# Patient Record
Sex: Male | Born: 1990
Health system: Southern US, Community
[De-identification: ages and names within clinical notes are randomized; demographics above are authoritative.]

## PROBLEM LIST (undated history)

## (undated) DIAGNOSIS — B0089 Other herpesviral infection: Secondary | ICD-10-CM

## (undated) DIAGNOSIS — W3400XA Accidental discharge from unspecified firearms or gun, initial encounter: Secondary | ICD-10-CM

## (undated) HISTORY — PX: LEG SURGERY: SHX1003

---
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
## 2013-07-20 ENCOUNTER — Emergency Department (HOSPITAL_COMMUNITY)
Admission: EM | Admit: 2013-07-20 | Discharge: 2013-07-20 | Disposition: A | Discharge status: Home / Self Care | Payer: Self-pay | Attending: Emergency Medicine | Admitting: Emergency Medicine

## 2013-07-20 ENCOUNTER — Encounter (HOSPITAL_COMMUNITY): Payer: Self-pay | Admitting: Emergency Medicine

## 2013-07-20 ENCOUNTER — Emergency Department (HOSPITAL_COMMUNITY): Payer: Self-pay

## 2013-07-20 DIAGNOSIS — Y9239 Other specified sports and athletic area as the place of occurrence of the external cause: Secondary | ICD-10-CM | POA: Insufficient documentation

## 2013-07-20 DIAGNOSIS — Y92838 Other recreation area as the place of occurrence of the external cause: Secondary | ICD-10-CM

## 2013-07-20 DIAGNOSIS — Y9367 Activity, basketball: Secondary | ICD-10-CM | POA: Insufficient documentation

## 2013-07-20 DIAGNOSIS — X500XXA Overexertion from strenuous movement or load, initial encounter: Secondary | ICD-10-CM | POA: Insufficient documentation

## 2013-07-20 DIAGNOSIS — S93409A Sprain of unspecified ligament of unspecified ankle, initial encounter: Secondary | ICD-10-CM | POA: Insufficient documentation

## 2013-07-20 DIAGNOSIS — M25476 Effusion, unspecified foot: Secondary | ICD-10-CM | POA: Insufficient documentation

## 2013-07-20 DIAGNOSIS — M25473 Effusion, unspecified ankle: Secondary | ICD-10-CM | POA: Insufficient documentation

## 2013-07-20 HISTORY — DX: Accidental discharge from unspecified firearms or gun, initial encounter: W34.00XA

## 2013-07-20 MED ORDER — IBUPROFEN 600 MG PO TABS: 600.0000 mg | ORAL_TABLET | Freq: Three times a day (TID) | ORAL | Status: DC

## 2013-07-20 MED ORDER — IBUPROFEN 200 MG PO TABS
600.0000 mg | ORAL_TABLET | Freq: Once | ORAL | Status: AC
  Administered 2013-07-20: 600 mg via ORAL
  Filled 2013-07-20: qty 3

## 2013-07-20 NOTE — ED Notes (Signed)
Pt reports that he has been having intermittent R ankle pain, tried to play basketball today and now is unable to walk on R leg d/t pain, mild swelling noted, ice applied in triage. Pt a&o, NAD noted at this time.

## 2013-07-20 NOTE — Discharge Instructions (Signed)
Acute Ankle Sprain  with Phase I Rehab  An acute ankle sprain is a partial or complete tear in one or more of the ligaments of the ankle due to traumatic injury. The severity of the injury depends on both the the number of ligaments sprained and the grade of sprain. There are 3 grades of sprains.   · A grade 1 sprain is a mild sprain. There is a slight pull without obvious tearing. There is no loss of strength, and the muscle and ligament are the correct length.  · A grade 2 sprain is a moderate sprain. There is tearing of fibers within the substance of the ligament where it connects two bones or two cartilages. The length of the ligament is increased, and there is usually decreased strength.  · A grade 3 sprain is a complete rupture of the ligament and is uncommon.  In addition to the grade of sprain, there are three types of ankle sprains.   Lateral ankle sprains: This is a sprain of one or more of the three ligaments on the outer side (lateral) of the ankle. These are the most common sprains.  Medial ankle sprains: There is one large triangular ligament of the inner side (medial) of the ankle that is susceptible to injury. Medial ankle sprains are less common.  Syndesmosis, "high ankle," sprains: The syndesmosis is the ligament that connects the two bones of the lower leg. Syndesmosis sprains usually only occur with very severe ankle sprains.  SYMPTOMS  · Pain, tenderness, and swelling in the ankle, starting at the side of injury that may progress to the whole ankle and foot with time.  · "Pop" or tearing sensation at the time of injury.  · Bruising that may spread to the heel.  · Impaired ability to walk soon after injury.  CAUSES   · Acute ankle sprains are caused by trauma placed on the ankle that temporarily forces or pries the anklebone (talus) out of its normal socket.  · Stretching or tearing of the ligaments that normally hold the joint in place (usually due to a twisting injury).  RISK INCREASES  WITH:  · Previous ankle sprain.  · Sports in which the foot may land awkwardly (ie. basketball, volleyball, or soccer) or walking or running on uneven or rough surfaces.  · Shoes with inadequate support to prevent sideways motion when stress occurs.  · Poor strength and flexibility.  · Poor balance skills.  · Contact sports.  PREVENTION   · Warm up and stretch properly before activity.  · Maintain physical fitness:  · Ankle and leg flexibility, muscle strength, and endurance.  · Cardiovascular fitness.  · Balance training activities.  · Use proper technique and have a coach correct improper technique.  · Taping, protective strapping, bracing, or high-top tennis shoes may help prevent injury. Initially, tape is best; however, it loses most of its support function within 10 to 15 minutes.  · Wear proper fitted protective shoes (High-top shoes with taping or bracing is more effective than either alone).  · Provide the ankle with support during sports and practice activities for 12 months following injury.  PROGNOSIS   · If treated properly, ankle sprains can be expected to recover completely; however, the length of recovery depends on the degree of injury.  · A grade 1 sprain usually heals enough in 5 to 7 days to allow modified activity and requires an average of 6 weeks to heal completely.  · A grade 2 sprain requires   symptoms may result in a chronic problem. Appropriately addressing the problem the first time decreases the frequency of recurrence and optimizes healing time. Severity of the initial sprain does not predict the likelihood of later instability.  Injury to other structures (bone, cartilage, or tendon).  A chronically unstable or arthritic ankle joint is a possiblity with repeated  sprains. TREATMENT Treatment initially involves the use of ice, medication, and compression bandages to help reduce pain and inflammation. Ankle sprains are usually immobilized in a walking cast or boot to allow for healing. Crutches may be recommended to reduce pressure on the injury. After immobilization, strengthening and stretching exercises may be necessary to regain strength and a full range of motion. Surgery is rarely needed to treat ankle sprains. MEDICATION   Nonsteroidal anti-inflammatory medications, such as aspirin and ibuprofen (do not take for the first 3 days after injury or within 7 days before surgery), or other minor pain relievers, such as acetaminophen, are often recommended. Take these as directed by your caregiver. Contact your caregiver immediately if any bleeding, stomach upset, or signs of an allergic reaction occur from these medications.  Ointments applied to the skin may be helpful.  Pain relievers may be prescribed as necessary by your caregiver. Do not take prescription pain medication for longer than 4 to 7 days. Use only as directed and only as much as you need. HEAT AND COLD  Cold treatment (icing) is used to relieve pain and reduce inflammation for acute and chronic cases. Cold should be applied for 10 to 15 minutes every 2 to 3 hours for inflammation and pain and immediately after any activity that aggravates your symptoms. Use ice packs or an ice massage.  Heat treatment may be used before performing stretching and strengthening activities prescribed by your caregiver. Use a heat pack or a warm soak. SEEK IMMEDIATE MEDICAL CARE IF:   Pain, swelling, or bruising worsens despite treatment.  You experience pain, numbness, discoloration, or coldness in the foot or toes.  New, unexplained symptoms develop (drugs used in treatment may produce side effects.) EXERCISES  PHASE I EXERCISES RANGE OF MOTION (ROM) AND STRETCHING EXERCISES - Ankle Sprain, Acute Phase I,  Weeks 1 to 2 These exercises may help you when beginning to restore flexibility in your ankle. You will likely work on these exercises for the 1 to 2 weeks after your injury. Once your physician, physical therapist, or athletic trainer sees adequate progress, he or she will advance your exercises. While completing these exercises, remember:   Restoring tissue flexibility helps normal motion to return to the joints. This allows healthier, less painful movement and activity.  An effective stretch should be held for at least 30 seconds.  A stretch should never be painful. You should only feel a gentle lengthening or release in the stretched tissue. RANGE OF MOTION - Dorsi/Plantar Flexion  While sitting with your right / left knee straight, draw the top of your foot upwards by flexing your ankle. Then reverse the motion, pointing your toes downward.  Hold each position for __________ seconds.  After completing your first set of exercises, repeat this exercise with your knee bent. Repeat __________ times. Complete this exercise __________ times per day.  RANGE OF MOTION - Ankle Alphabet  Imagine your right / left big toe is a pen.  Keeping your hip and knee still, write out the entire alphabet with your "pen." Make the letters as large as you can without increasing any discomfort. Repeat __________ times. Complete this  exercise __________ times per day.  STRENGTHENING EXERCISES - Ankle Sprain, Acute -Phase I, Weeks 1 to 2 These exercises may help you when beginning to restore strength in your ankle. You will likely work on these exercises for 1 to 2 weeks after your injury. Once your physician, physical therapist, or athletic trainer sees adequate progress, he or she will advance your exercises. While completing these exercises, remember:   Muscles can gain both the endurance and the strength needed for everyday activities through controlled exercises.  Complete these exercises as instructed by  your physician, physical therapist, or athletic trainer. Progress the resistance and repetitions only as guided.  You may experience muscle soreness or fatigue, but the pain or discomfort you are trying to eliminate should never worsen during these exercises. If this pain does worsen, stop and make certain you are following the directions exactly. If the pain is still present after adjustments, discontinue the exercise until you can discuss the trouble with your clinician. STRENGTH - Dorsiflexors  Secure a rubber exercise band/tubing to a fixed object (ie. table, pole) and loop the other end around your right / left foot.  Sit on the floor facing the fixed object. The band/tubing should be slightly tense when your foot is relaxed.  Slowly draw your foot back toward you using your ankle and toes.  Hold this position for __________ seconds. Slowly release the tension in the band and return your foot to the starting position. Repeat __________ times. Complete this exercise __________ times per day.  STRENGTH - Plantar-flexors   Sit with your right / left leg extended. Holding onto both ends of a rubber exercise band/tubing, loop it around the ball of your foot. Keep a slight tension in the band.  Slowly push your toes away from you, pointing them downward.  Hold this position for __________ seconds. Return slowly, controlling the tension in the band/tubing. Repeat __________ times. Complete this exercise __________ times per day.  STRENGTH - Ankle Eversion  Secure one end of a rubber exercise band/tubing to a fixed object (table, pole). Loop the other end around your foot just before your toes.  Place your fists between your knees. This will focus your strengthening at your ankle.  Drawing the band/tubing across your opposite foot, slowly, pull your little toe out and up. Make sure the band/tubing is positioned to resist the entire motion.  Hold this position for __________ seconds. Have  your muscles resist the band/tubing as it slowly pulls your foot back to the starting position.  Repeat __________ times. Complete this exercise __________ times per day.  STRENGTH - Ankle Inversion  Secure one end of a rubber exercise band/tubing to a fixed object (table, pole). Loop the other end around your foot just before your toes.  Place your fists between your knees. This will focus your strengthening at your ankle.  Slowly, pull your big toe up and in, making sure the band/tubing is positioned to resist the entire motion.  Hold this position for __________ seconds.  Have your muscles resist the band/tubing as it slowly pulls your foot back to the starting position. Repeat __________ times. Complete this exercises __________ times per day.  STRENGTH - Towel Curls  Sit in a chair positioned on a non-carpeted surface.  Place your right / left foot on a towel, keeping your heel on the floor.  Pull the towel toward your heel by only curling your toes. Keep your heel on the floor.  If instructed by your physician,  physical therapist, or athletic trainer, add weight to the end of the towel. °Repeat ______5____ times. Complete this exercise ______2____ times per day. °Document Released: 01/12/2005 Document Revised: 09/05/2011 Document Reviewed: 09/25/2008 °[employer]® Patient Information ©2014 [employer]. ° °

## 2013-07-20 NOTE — ED Provider Notes (Signed)
Medical screening examination/treatment/procedure(s) were performed by non-physician practitioner and as supervising physician I was immediately available for consultation/collaboration.  EKG Interpretation   None        Ethelda ChickMartha K Linker, MD 07/20/13 2314
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
## 2015-01-19 ENCOUNTER — Emergency Department (HOSPITAL_COMMUNITY)
Admission: EM | Admit: 2015-01-19 | Discharge: 2015-01-19 | Disposition: A | Discharge status: Home / Self Care | Payer: Self-pay | Attending: Emergency Medicine | Admitting: Emergency Medicine

## 2015-01-19 ENCOUNTER — Emergency Department (HOSPITAL_COMMUNITY): Payer: Self-pay

## 2015-01-19 ENCOUNTER — Encounter (HOSPITAL_COMMUNITY): Payer: Self-pay | Admitting: Emergency Medicine

## 2015-01-19 DIAGNOSIS — W231XXA Caught, crushed, jammed, or pinched between stationary objects, initial encounter: Secondary | ICD-10-CM | POA: Insufficient documentation

## 2015-01-19 DIAGNOSIS — S6000XA Contusion of unspecified finger without damage to nail, initial encounter: Secondary | ICD-10-CM

## 2015-01-19 DIAGNOSIS — Y9289 Other specified places as the place of occurrence of the external cause: Secondary | ICD-10-CM | POA: Insufficient documentation

## 2015-01-19 DIAGNOSIS — S60042A Contusion of left ring finger without damage to nail, initial encounter: Secondary | ICD-10-CM | POA: Insufficient documentation

## 2015-01-19 DIAGNOSIS — Y9389 Activity, other specified: Secondary | ICD-10-CM | POA: Insufficient documentation

## 2015-01-19 DIAGNOSIS — Y998 Other external cause status: Secondary | ICD-10-CM | POA: Insufficient documentation

## 2015-01-19 MED ORDER — IBUPROFEN 400 MG PO TABS
600.0000 mg | ORAL_TABLET | Freq: Once | ORAL | Status: AC
  Administered 2015-01-19: 600 mg via ORAL
  Filled 2015-01-19 (×2): qty 1

## 2015-01-19 MED ORDER — IBUPROFEN 600 MG PO TABS: 600.0000 mg | ORAL_TABLET | Freq: Four times a day (QID) | ORAL | Status: DC | PRN
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
## 2015-01-19 NOTE — Discharge Instructions (Signed)
Subungual Hematoma A subungual hematoma is a pocket of blood that collects under the fingernail or toenail. The pressure created by the blood under the nail can cause pain. CAUSES  A subungual hematoma occurs when an injury to the finger or toe causes a blood vessel beneath the nail to break. The injury can occur from a direct blow such as slamming a finger in a door. It can also occur from a repeated injury such as pressure on the foot in a shoe while running. A subungual hematoma is sometimes called runner's toe or tennis toe. SYMPTOMS   Blue or dark blue skin under the nail.  Pain or throbbing in the injured area. DIAGNOSIS  Your caregiver can determine whether you have a subungual hematoma based on your history and a physical exam. If your caregiver thinks you might have a broken (fractured) bone, X-rays may be taken. TREATMENT  Hematomas usually go away on their own over time. Your caregiver may make a hole in the nail to drain the blood. The nail usually grows back normally after this procedure. In some cases, the nail may need to be removed. This is done if there is a cut under the nail that requires stitches (sutures). HOME CARE INSTRUCTIONS   Put ice on the injured area.  Put ice in a plastic bag.  Place a towel between your skin and the bag.  Leave the ice on for 15-20 minutes, 03-04 times a day for the first 1 to 2 days.  Elevate the injured area to help decrease pain and swelling.  If you were given a bandage, wear it for as long as directed by your caregiver.  If part of your nail falls off, trim the remaining nail gently. This prevents the nail from catching on something and causing further injury.  Only take over-the-counter or prescription medicines for pain, discomfort, or fever as directed by your caregiver. SEEK IMMEDIATE MEDICAL CARE IF:   You have redness or swelling around the nail.  You have yellowish-white fluid (pus) coming from the nail.  Your pain is not  controlled with medicine.  You have a fever. MAKE SURE YOU:  Understand these instructions.  Will watch your condition.  Will get help right away if you are not doing well or get worse. Document Released: 06/10/2000 Document Revised: 09/05/2011 Document Reviewed: 06/01/2011 [facility] Patient Information 2015 [place], [place]. This information is not intended to replace advice given to you by your health care provider. Make sure you discuss any questions you have with your health care provider.

## 2015-01-19 NOTE — ED Notes (Signed)
Pt ambulating independently w/ steady gait on d/c in no acute distress, A&Ox4. D/c instructions reviewed w/ pt and family - pt and family deny any further questions or concerns at present. Rx given x1  
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
## 2016-03-10 ENCOUNTER — Emergency Department (HOSPITAL_COMMUNITY)
Admission: EM | Admit: 2016-03-10 | Discharge: 2016-03-11 | Disposition: A | Discharge status: Home / Self Care | Payer: Self-pay | Attending: Emergency Medicine | Admitting: Emergency Medicine

## 2016-03-10 ENCOUNTER — Encounter (HOSPITAL_COMMUNITY): Payer: Self-pay | Admitting: *Deleted

## 2016-03-10 DIAGNOSIS — R238 Other skin changes: Secondary | ICD-10-CM

## 2016-03-10 DIAGNOSIS — N501 Vascular disorders of male genital organs: Secondary | ICD-10-CM | POA: Insufficient documentation
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
## 2016-03-10 NOTE — ED Triage Notes (Signed)
Pt states that he has "bumps" on his penis.  No urinary symptoms, no drainage from penis, no itching.

## 2016-12-19 ENCOUNTER — Encounter (HOSPITAL_COMMUNITY): Payer: Self-pay | Admitting: Emergency Medicine

## 2016-12-19 ENCOUNTER — Encounter (HOSPITAL_COMMUNITY): Payer: Self-pay

## 2016-12-19 ENCOUNTER — Emergency Department (HOSPITAL_COMMUNITY)
Admission: EM | Admit: 2016-12-19 | Discharge: 2016-12-19 | Disposition: A | Discharge status: Home / Self Care | Payer: Self-pay | Attending: Emergency Medicine | Admitting: Emergency Medicine

## 2016-12-19 ENCOUNTER — Ambulatory Visit (HOSPITAL_COMMUNITY)
Admission: EM | Admit: 2016-12-19 | Discharge: 2016-12-19 | Disposition: A | Discharge status: Home / Self Care | Payer: Self-pay | Attending: Family Medicine | Admitting: Family Medicine

## 2016-12-19 DIAGNOSIS — K029 Dental caries, unspecified: Secondary | ICD-10-CM

## 2016-12-19 DIAGNOSIS — Z5321 Procedure and treatment not carried out due to patient leaving prior to being seen by health care provider: Secondary | ICD-10-CM | POA: Insufficient documentation

## 2016-12-19 DIAGNOSIS — K0889 Other specified disorders of teeth and supporting structures: Secondary | ICD-10-CM

## 2016-12-19 MED ORDER — IBUPROFEN 800 MG PO TABS: 800.0000 mg | ORAL_TABLET | Freq: Three times a day (TID) | ORAL | 0 refills | Status: DC

## 2016-12-19 MED ORDER — PENICILLIN V POTASSIUM 500 MG PO TABS: 500.0000 mg | ORAL_TABLET | Freq: Three times a day (TID) | ORAL | 0 refills | Status: DC

## 2016-12-19 NOTE — Discharge Instructions (Signed)
You need to see a dentist to have this problem taken care of. One possible dentist as listed below.
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
## 2016-12-19 NOTE — ED Triage Notes (Signed)
Pt states he has had a toothache X2 days. Broken tooth noted. He reports associated headache as well.

## 2016-12-19 NOTE — ED Notes (Signed)
[facility]

## 2016-12-19 NOTE — ED Notes (Signed)
Called to room with no answer x 2.

## 2016-12-19 NOTE — ED Triage Notes (Signed)
The patient presented to the [facility] with a complaint of dental pain x 2 days. 

## 2017-02-24 ENCOUNTER — Ambulatory Visit (HOSPITAL_COMMUNITY)
Admission: EM | Admit: 2017-02-24 | Discharge: 2017-02-24 | Disposition: A | Discharge status: Home / Self Care | Payer: Self-pay | Attending: Physician Assistant | Admitting: Physician Assistant

## 2017-02-24 ENCOUNTER — Encounter (HOSPITAL_COMMUNITY): Payer: Self-pay | Admitting: Physician Assistant

## 2017-02-24 DIAGNOSIS — L0291 Cutaneous abscess, unspecified: Secondary | ICD-10-CM

## 2017-02-24 MED ORDER — LIDOCAINE-EPINEPHRINE (PF) 2 %-1:200000 IJ SOLN
INTRAMUSCULAR | Status: AC
  Filled 2017-02-24: qty 20

## 2017-02-24 MED ORDER — AMOXICILLIN-POT CLAVULANATE 875-125 MG PO TABS: 1.0000 | ORAL_TABLET | Freq: Two times a day (BID) | ORAL | 0 refills | Status: DC

## 2017-02-24 NOTE — ED Triage Notes (Signed)
Patient with abscess to left lower chin. Drainage noted.

## 2017-02-24 NOTE — Discharge Instructions (Signed)
Start Augmentin as directed. Keep incision site clean and dry with soap and water. Take tylenol/motrin for pain. If experiencing worsening symptoms, fever, increased swelling, spreading redness, increased warmth, increased pain, follow-up for reevaluation. If experiencing trouble breathing, trouble swallowing, swelling of the throat, go to the emergency department for further evaluation.
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
## 2017-02-24 NOTE — ED Notes (Signed)
Patient given supplies for home dressing changes. S/s of infection reviewed.

## 2017-02-27 ENCOUNTER — Encounter (HOSPITAL_COMMUNITY): Payer: Self-pay | Admitting: Emergency Medicine

## 2017-02-27 ENCOUNTER — Emergency Department (HOSPITAL_COMMUNITY)
Admission: EM | Admit: 2017-02-27 | Discharge: 2017-02-27 | Disposition: A | Discharge status: Home / Self Care | Payer: Self-pay | Attending: Emergency Medicine | Admitting: Emergency Medicine

## 2017-02-27 ENCOUNTER — Emergency Department (HOSPITAL_COMMUNITY): Payer: Self-pay

## 2017-02-27 DIAGNOSIS — Z79899 Other long term (current) drug therapy: Secondary | ICD-10-CM | POA: Insufficient documentation

## 2017-02-27 DIAGNOSIS — L0201 Cutaneous abscess of face: Secondary | ICD-10-CM | POA: Insufficient documentation

## 2017-02-27 DIAGNOSIS — L0291 Cutaneous abscess, unspecified: Secondary | ICD-10-CM

## 2017-02-27 LAB — CBC WITH DIFFERENTIAL/PLATELET
BASOS ABS: 0 10*3/uL (ref 0.0–0.1)
Basophils Relative: 0 %
EOS PCT: 2 %
Eosinophils Absolute: 0.1 10*3/uL (ref 0.0–0.7)
HCT: 41.1 % (ref 39.0–52.0)
Hemoglobin: 14 g/dL (ref 13.0–17.0)
LYMPHS PCT: 29 %
Lymphs Abs: 2.1 10*3/uL (ref 0.7–4.0)
MCH: 29.9 pg (ref 26.0–34.0)
MCHC: 34.1 g/dL (ref 30.0–36.0)
MCV: 87.8 fL (ref 78.0–100.0)
MONO ABS: 0.7 10*3/uL (ref 0.1–1.0)
MONOS PCT: 9 %
Neutro Abs: 4.3 10*3/uL (ref 1.7–7.7)
Neutrophils Relative %: 60 %
PLATELETS: 310 10*3/uL (ref 150–400)
RBC: 4.68 MIL/uL (ref 4.22–5.81)
RDW: 13 % (ref 11.5–15.5)
WBC: 7.2 10*3/uL (ref 4.0–10.5)

## 2017-02-27 LAB — I-STAT CHEM 8, ED
BUN: 8 mg/dL (ref 6–20)
CREATININE: 0.9 mg/dL (ref 0.61–1.24)
Calcium, Ion: 1.21 mmol/L (ref 1.15–1.40)
Chloride: 103 mmol/L (ref 101–111)
GLUCOSE: 87 mg/dL (ref 65–99)
HCT: 42 % (ref 39.0–52.0)
HEMOGLOBIN: 14.3 g/dL (ref 13.0–17.0)
POTASSIUM: 3.9 mmol/L (ref 3.5–5.1)
Sodium: 141 mmol/L (ref 135–145)
TCO2: 28 mmol/L (ref 22–32)

## 2017-02-27 MED ORDER — CLINDAMYCIN PHOSPHATE 600 MG/50ML IV SOLN
600.0000 mg | Freq: Once | INTRAVENOUS | Status: AC
  Administered 2017-02-27: 600 mg via INTRAVENOUS
  Filled 2017-02-27: qty 50

## 2017-02-27 MED ORDER — LIDOCAINE HCL (PF) 1 % IJ SOLN
5.0000 mL | Freq: Once | INTRAMUSCULAR | Status: AC
  Administered 2017-02-27: 5 mL via INTRADERMAL
  Filled 2017-02-27: qty 5

## 2017-02-27 MED ORDER — IOPAMIDOL (ISOVUE-300) INJECTION 61%
INTRAVENOUS | Status: AC
  Administered 2017-02-27: 75 mL
  Filled 2017-02-27: qty 75

## 2017-02-27 NOTE — Discharge Instructions (Signed)
Please follow with your primary care doctor in the next 2 days for a check-up. They must obtain records for further management.  ° °Do not hesitate to return to the Emergency Department for any new, worsening or concerning symptoms.  ° °

## 2017-02-27 NOTE — ED Notes (Signed)
Patient transported to CT 
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
## 2017-02-27 NOTE — ED Triage Notes (Signed)
Abscess noted to left side of neck.  Reports going to UC two days ago and that it has gotten worse since then.  Still taking antibiotics.

## 2017-05-14 ENCOUNTER — Emergency Department (HOSPITAL_COMMUNITY)
Admission: EM | Admit: 2017-05-14 | Discharge: 2017-05-14 | Disposition: A | Discharge status: Home / Self Care | Payer: Self-pay | Attending: Emergency Medicine | Admitting: Emergency Medicine

## 2017-05-14 ENCOUNTER — Encounter (HOSPITAL_COMMUNITY): Payer: Self-pay | Admitting: *Deleted

## 2017-05-14 ENCOUNTER — Other Ambulatory Visit: Payer: Self-pay

## 2017-05-14 DIAGNOSIS — Z79899 Other long term (current) drug therapy: Secondary | ICD-10-CM | POA: Insufficient documentation

## 2017-05-14 DIAGNOSIS — B0089 Other herpesviral infection: Secondary | ICD-10-CM | POA: Insufficient documentation

## 2017-05-14 MED ORDER — VALACYCLOVIR HCL 500 MG PO TABS
1000.0000 mg | ORAL_TABLET | Freq: Once | ORAL | Status: AC
  Administered 2017-05-14: 1000 mg via ORAL
  Filled 2017-05-14: qty 2

## 2017-05-14 MED ORDER — VALACYCLOVIR HCL 1 G PO TABS: 1000.0000 mg | ORAL_TABLET | Freq: Two times a day (BID) | ORAL | 0 refills | Status: AC

## 2017-05-14 NOTE — ED Notes (Signed)
Declined W/C at D/C and was escorted to lobby by RN. 

## 2017-05-14 NOTE — ED Triage Notes (Signed)
PT states R thumb pain and blisters x 3 days.
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
## 2017-05-14 NOTE — Care Management Note (Signed)
Case Management Note  Patient Details  Name: Philip Lowery MRN: [id number] Date of Birth: 05/29/1991  Subjective/Objective:  26 y.o. [facility]. Pt does not have Insurance/PCP. Aware that he can go to Open clinic on Thursday at 0800 as first come first serve and have assist with both. Provided with MATCH letter                  Action/Plan:CM will sign off for now but will be available should additional discharge needs arise or disposition change.    Expected Discharge Date:                  Expected Discharge Plan:     In-House Referral:     Discharge planning Services  CM Consult, Indigent Health Clinic  Post Acute Care Choice:    Choice offered to:  Patient  DME Arranged:    DME Agency:     HH Arranged:    HH Agency:     Status of Service:  Completed, signed off  If discussed at [employer] Meetings, dates discussed:    Additional Comments:  Yvone NeuCrutchfield, Nurah Petrides M, RN 05/14/2017, 11:45 AM

## 2017-05-14 NOTE — Discharge Instructions (Signed)
Please take 1 tablet of Valtrex 2 times per day for the next 7 days.   Please follow-up with [place] and wellness as directed by the care management team.  If your symptoms do not improve after taking treatment for 3-4 days, please make sure to schedule a follow-up appointment because hand surgery may need to be contacted.  You can follow up with the health department to get screened for sexually transmitted infections.  If you develop new or worsening symptoms, including bumps on your penis, headache, confusion, or fever, please return to the emergency department for reevaluation.

## 2017-05-24 ENCOUNTER — Other Ambulatory Visit: Payer: Self-pay

## 2017-05-24 ENCOUNTER — Ambulatory Visit: Payer: Self-pay | Attending: Internal Medicine | Admitting: Physician Assistant

## 2017-05-24 VITALS — BP 135/86 | HR 87 | Temp 98.3°F | Resp 16 | Wt 154.0 lb

## 2017-05-24 DIAGNOSIS — R030 Elevated blood-pressure reading, without diagnosis of hypertension: Secondary | ICD-10-CM

## 2017-05-24 DIAGNOSIS — B0089 Other herpesviral infection: Secondary | ICD-10-CM

## 2017-05-24 MED ORDER — VALACYCLOVIR HCL 1 G PO TABS: 1000.0000 mg | ORAL_TABLET | Freq: Two times a day (BID) | ORAL | 0 refills | Status: DC
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
## 2017-05-24 NOTE — Patient Instructions (Signed)
Check blood pressure 2-3 times per week and record and bring to next visit.

## 2017-06-07 MED FILL — ?VALACYCLOVIR HCL 1 GRAM TA: 1 | 10 days supply | Qty: 20 | Fill #0

## 2017-07-05 ENCOUNTER — Encounter: Payer: Self-pay | Admitting: Nurse Practitioner

## 2017-07-05 ENCOUNTER — Ambulatory Visit: Payer: Self-pay | Attending: Nurse Practitioner | Admitting: Nurse Practitioner

## 2017-07-05 VITALS — BP 106/65 | HR 69 | Temp 97.8°F | Ht 69.0 in | Wt 160.4 lb

## 2017-07-05 DIAGNOSIS — Z9889 Other specified postprocedural states: Secondary | ICD-10-CM | POA: Insufficient documentation

## 2017-07-05 DIAGNOSIS — Z79899 Other long term (current) drug therapy: Secondary | ICD-10-CM | POA: Insufficient documentation

## 2017-07-05 DIAGNOSIS — B0089 Other herpesviral infection: Secondary | ICD-10-CM | POA: Insufficient documentation

## 2017-07-05 MED ORDER — AMOXICILLIN-POT CLAVULANATE 875-125 MG PO TABS: 1.0000 | ORAL_TABLET | Freq: Two times a day (BID) | ORAL | 0 refills | Status: DC

## 2017-07-05 MED FILL — AMOX-CLAV 875-125 MG TABLET: 875-125 | 10 days supply | Qty: 20 | Fill #0

## 2017-07-05 NOTE — Patient Instructions (Addendum)
Herpetic Whitlow  Herpetic whitlow is an infection that affects the skin. It most commonly affects the fingers. The infection can recur, but the first occurrence is usually the most severe.  What are the causes?  This condition is caused by herpes simplex virus (HSV) type 1 and HSV type 2. You can get herpetic whitlow if body fluids that are infected with either of these viruses get into a break in the skin.  HSV commonly affects the mouth and genitals, but it can affect many other parts of the body. Most people who get herpetic whitlow have an HSV infection in another part of the body that spreads to the fingers through a cut. Health care workers can get herpetic whitlow from touching the mouths of patients who have oral herpes.  What increases the risk?  This condition is more likely to develop in:   People who have an HSV infection.   People who work in the health care industry, particularly in dentistry.    What are the signs or symptoms?  Symptoms of this condition usually develop 2-20 days after exposure to the virus. Early symptoms include:   Pain, burning, or tingling in the infected area.   Fever.   Redness.   Swelling.    About 7-10 days after symptoms begin to appear, the lymph nodes may swell, and rice-sized fluid-filled bumps will develop on the infected area. These bumps will develop into sores or break open. Symptoms usually improve 10-14 days later when the sores crust over and heal.  How is this diagnosed?  This condition may be diagnosed with a physical exam. A sample of your skin or your blood may also be taken for testing.  How is this treated?  This condition goes away on its own. Your health care provider may prescribe topical or oral antiviral medicines to relieve symptoms and to prevent the infection from spreading to others.  Follow these instructions at home:   Take or apply medicines only as directed by your health care provider.   Wash your hands often.   If you work in the  health care industry, wear gloves.   Apply ice to the affected area:  ? Put ice in a plastic bag.  ? Place a towel between your skin and the bag.  ? Leave the ice on for 20 minutes, 2-3 times per day.   The infection can spread to other people. It can also spread to other areas of your body, especially to your mouth and your genital area. To keep it from spreading:  ? Cover the affected area with a bandage (dressing). If you work in the health care industry, wear gloves.  ? Avoid close contact with other people until the bumps heal.  ? Do not share towels and washcloths.  ? Do not touch the bumps with your other hand or pick at your scabs.  ? Do not touch your eyes, mouth, or genital area unless you wash your hands first.  Contact a health care provider if:   The infection has spread to another area of your body.   Your symptoms become severe.   The infection recurs.  This information is not intended to replace advice given to you by your health care provider. Make sure you discuss any questions you have with your health care provider.  Document Released: 09/03/2002 Document Revised: 02/03/2016 Document Reviewed: 03/25/2014  Elsevier Interactive Patient Education  2017 [employer]
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
## 2017-07-06 LAB — HSV TYPE I/II IGG, IGMW/ REFLEX
HSV 1 Glycoprotein G Ab, IgG: <0.91 index (ref 0.00–0.90)
HSV 1 IgM: <1:10 {titer}
HSV 2 IgG, Type Spec: >23.6 index — ABNORMAL HIGH (ref 0.00–0.90)

## 2017-07-10 ENCOUNTER — Telehealth: Payer: Self-pay

## 2017-07-10 NOTE — Telephone Encounter (Signed)
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

## 2017-07-10 NOTE — Telephone Encounter (Signed)
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 

## 2017-07-10 NOTE — Telephone Encounter (Signed)
CMA called patient regarding lab result.   Patient understood.  

## 2017-08-31 ENCOUNTER — Ambulatory Visit: Payer: Self-pay | Attending: Family Medicine | Admitting: Physician Assistant

## 2017-08-31 VITALS — BP 115/74 | HR 81 | Temp 97.8°F | Ht 69.0 in | Wt 154.6 lb

## 2017-08-31 DIAGNOSIS — B0089 Other herpesviral infection: Secondary | ICD-10-CM | POA: Insufficient documentation

## 2017-08-31 MED ORDER — ACYCLOVIR 400 MG PO TABS: 400.0000 mg | ORAL_TABLET | Freq: Two times a day (BID) | ORAL | 5 refills | Status: DC

## 2017-08-31 MED FILL — ACYCLOVIR 400 MG TABLET: 400 | 30 days supply | Qty: 60 | Fill #0

## 2017-08-31 NOTE — Progress Notes (Signed)
Pt's is here for herpetic whitlow that appears on his right hand for a week now.  Pt's stated it was itchy when it first appear.
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
## 2017-10-08 ENCOUNTER — Emergency Department (HOSPITAL_COMMUNITY)
Admission: EM | Admit: 2017-10-08 | Discharge: 2017-10-08 | Disposition: A | Discharge status: Home / Self Care | Payer: Self-pay | Attending: Emergency Medicine | Admitting: Emergency Medicine

## 2017-10-08 DIAGNOSIS — H61892 Other specified disorders of left external ear: Secondary | ICD-10-CM

## 2017-10-08 DIAGNOSIS — H9202 Otalgia, left ear: Secondary | ICD-10-CM | POA: Insufficient documentation

## 2017-10-08 MED ORDER — NEOMYCIN-COLIST-HC-THONZONIUM 3.3-3-10-0.5 MG/ML OT SUSP
4.0000 [drp] | Freq: Once | OTIC | Status: DC
  Filled 2017-10-08: qty 10

## 2017-10-08 MED ORDER — NEOMYCIN-POLYMYXIN-HC 3.5-10000-1 OT SUSP
4.0000 [drp] | Freq: Once | OTIC | Status: AC
  Administered 2017-10-08: 4 [drp] via OTIC
  Filled 2017-10-08: qty 10

## 2017-10-08 NOTE — ED Triage Notes (Signed)
Pt states yesterday he was in the car and he felt a bug hit his left ear. He states he thinks the bug is in his ear. No bug noticeable upon assessment with otoscope. Ear wax noted.

## 2017-10-08 NOTE — Discharge Instructions (Signed)
Use the ear drops 4 drops to the left ear 3 times a day for the next 5 days. Follow up with your doctor. Return here for worsening symptoms.
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
## 2018-05-14 ENCOUNTER — Emergency Department (HOSPITAL_COMMUNITY): Payer: Self-pay

## 2018-05-14 ENCOUNTER — Encounter (HOSPITAL_COMMUNITY): Payer: Self-pay | Admitting: Radiology

## 2018-05-14 ENCOUNTER — Emergency Department (HOSPITAL_COMMUNITY)
Admission: EM | Admit: 2018-05-14 | Discharge: 2018-05-14 | Disposition: A | Discharge status: Home / Self Care | Payer: Self-pay | Attending: Emergency Medicine | Admitting: Emergency Medicine

## 2018-05-14 DIAGNOSIS — L03211 Cellulitis of face: Secondary | ICD-10-CM | POA: Insufficient documentation

## 2018-05-14 DIAGNOSIS — J340 Abscess, furuncle and carbuncle of nose: Secondary | ICD-10-CM | POA: Insufficient documentation

## 2018-05-14 DIAGNOSIS — Z79899 Other long term (current) drug therapy: Secondary | ICD-10-CM | POA: Insufficient documentation

## 2018-05-14 LAB — CBC WITH DIFFERENTIAL/PLATELET
Abs Immature Granulocytes: 0.06 10*3/uL (ref 0.00–0.07)
Basophils Absolute: 0 10*3/uL (ref 0.0–0.1)
Basophils Relative: 0 %
Eosinophils Absolute: 0 10*3/uL (ref 0.0–0.5)
Eosinophils Relative: 0 %
HEMATOCRIT: 50.2 % (ref 39.0–52.0)
HEMOGLOBIN: 15.4 g/dL (ref 13.0–17.0)
Immature Granulocytes: 1 %
Lymphocytes Relative: 13 %
Lymphs Abs: 1.3 10*3/uL (ref 0.7–4.0)
MCH: 28.8 pg (ref 26.0–34.0)
MCHC: 30.7 g/dL (ref 30.0–36.0)
MCV: 93.8 fL (ref 80.0–100.0)
MONO ABS: 0.7 10*3/uL (ref 0.1–1.0)
MONOS PCT: 7 %
NEUTROS PCT: 79 %
NRBC: 0 % (ref 0.0–0.2)
Neutro Abs: 7.7 10*3/uL (ref 1.7–7.7)
Platelets: 275 10*3/uL (ref 150–400)
RBC: 5.35 MIL/uL (ref 4.22–5.81)
RDW: 13.8 % (ref 11.5–15.5)
WBC: 9.9 10*3/uL (ref 4.0–10.5)

## 2018-05-14 LAB — I-STAT CHEM 8, ED
BUN: 7 mg/dL (ref 6–20)
CALCIUM ION: 1.01 mmol/L — AB (ref 1.15–1.40)
CHLORIDE: 106 mmol/L (ref 98–111)
CREATININE: 1.3 mg/dL — AB (ref 0.61–1.24)
Glucose, Bld: 88 mg/dL (ref 70–99)
HCT: 48 % (ref 39.0–52.0)
Hemoglobin: 16.3 g/dL (ref 13.0–17.0)
Potassium: 4.1 mmol/L (ref 3.5–5.1)
SODIUM: 136 mmol/L (ref 135–145)
TCO2: 24 mmol/L (ref 22–32)

## 2018-05-14 MED ORDER — SODIUM CHLORIDE 0.9 % IV BOLUS
1000.0000 mL | Freq: Once | INTRAVENOUS | Status: AC
  Administered 2018-05-14: 1000 mL via INTRAVENOUS

## 2018-05-14 MED ORDER — IOHEXOL 300 MG/ML  SOLN
75.0000 mL | Freq: Once | INTRAMUSCULAR | Status: AC | PRN
  Administered 2018-05-14: 75 mL via INTRAVENOUS

## 2018-05-14 MED ORDER — CLINDAMYCIN HCL 150 MG PO CAPS: 300.0000 mg | ORAL_CAPSULE | Freq: Three times a day (TID) | ORAL | 0 refills | Status: DC

## 2018-05-14 MED ORDER — IBUPROFEN 600 MG PO TABS: 600.0000 mg | ORAL_TABLET | Freq: Four times a day (QID) | ORAL | 0 refills | Status: AC | PRN

## 2018-05-14 MED ORDER — CLINDAMYCIN PHOSPHATE 600 MG/50ML IV SOLN
600.0000 mg | Freq: Once | INTRAVENOUS | Status: AC
  Administered 2018-05-14: 600 mg via INTRAVENOUS
  Filled 2018-05-14: qty 50

## 2018-05-14 MED ORDER — LIDOCAINE HCL (PF) 1 % IJ SOLN
5.0000 mL | Freq: Once | INTRAMUSCULAR | Status: DC
  Filled 2018-05-14: qty 5

## 2018-05-14 NOTE — Discharge Instructions (Signed)
Your abscess is draining spontaneously.  At this time I would recommend that you continue taking antibiotics including clindamycin, 300 mg by mouth 3 times a day for the next 10 days.  This medicine will help to reduce the infection in your skin however if you should develop increasing pain, redness, fever, swelling he will need to come back to the emergency department immediately as this could indicate a more aggressive infection.    Your abscess is draining, and because of the difficult location of this abscess we are unable to make an incision in the skin at this time.  If you do develop worsening symptoms you may need to have a more advanced procedures will come back to the emergency department for those findings of increased pain swelling redness or fever

## 2018-05-14 NOTE — ED Notes (Signed)
Pt returns from ct  

## 2018-05-14 NOTE — ED Notes (Signed)
Declined W/C at D/C and was escorted to lobby by RN. 

## 2018-05-14 NOTE — ED Notes (Signed)
ED Provider at bedside. 

## 2018-05-14 NOTE — ED Triage Notes (Signed)
Pt here with c/o right side of face times two days , airway intact
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
## 2019-02-24 IMAGING — CT CT MAXILLOFACIAL W/ CM
3 series · 16 of 47 positions shown, 19 images · IV contrast (omnipaque)
Comparison: 02/27/2017

CLINICAL DATA: Increased swelling of right face after increased
right facial swelling.

EXAM:
CT MAXILLOFACIAL WITH CONTRAST
TECHNIQUE: Multidetector CT imaging of the maxillofacial structures was
performed with intravenous contrast. Multiplanar CT image
reconstructions were also generated.
CONTRAST:  75mL OMNIPAQUE IOHEXOL 300 MG/ML  SOLN

[Series 3: facialbone 2.0 st · axial · 0.34mm/px · z∈[-270,-120]mm · 10 of 87 slices shown, 13 images]
[im 6/87  brain]
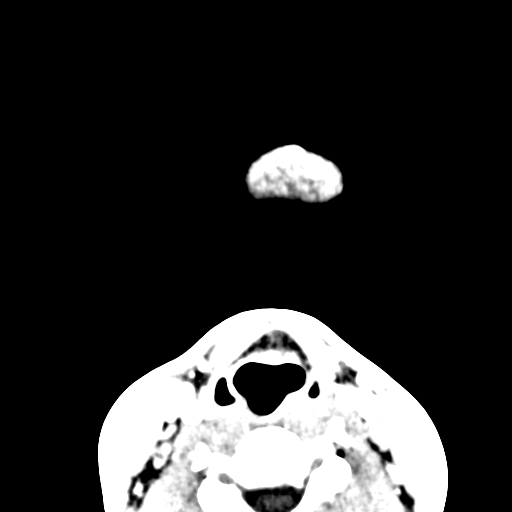
[im 6/87  bone]
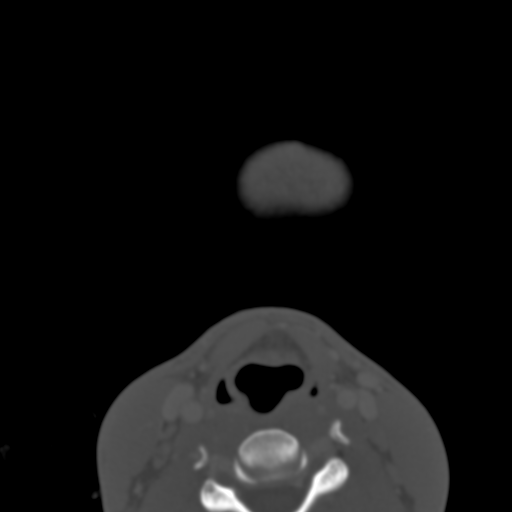
[im 15/87  bone]
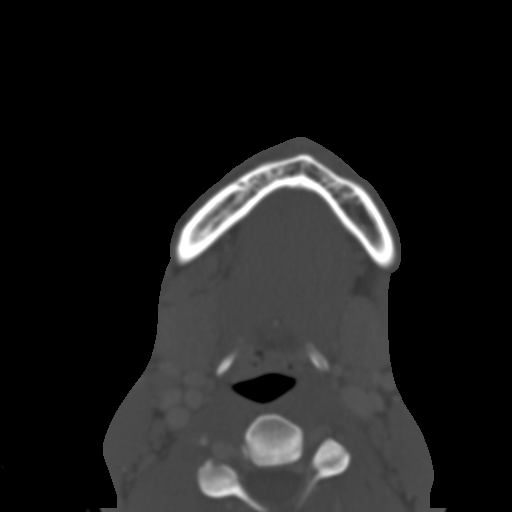
[im 24/87  bone]
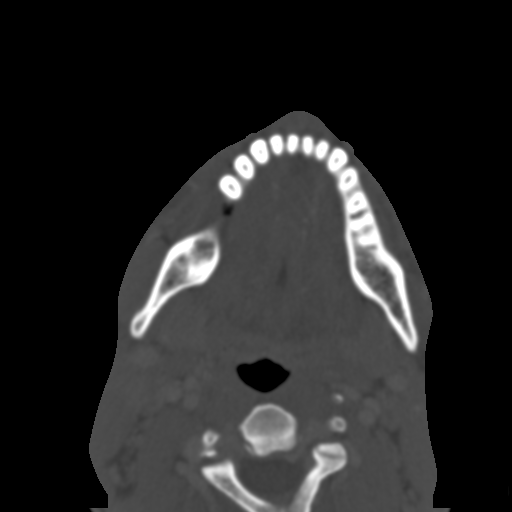
[im 30/87  bone]
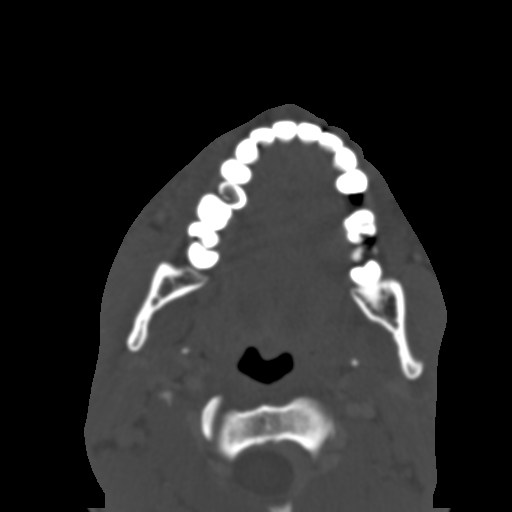
[im 39/87  brain]
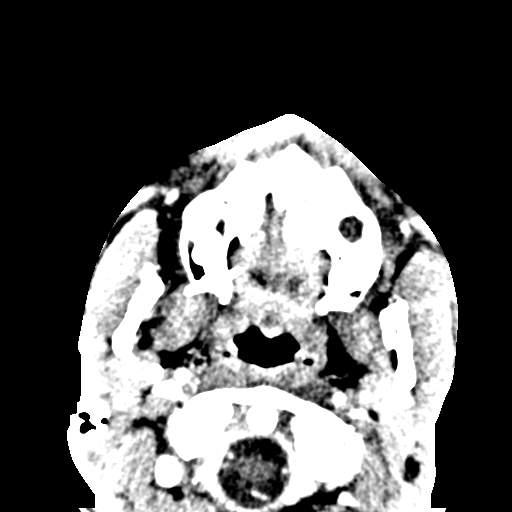
[im 39/87  bone]
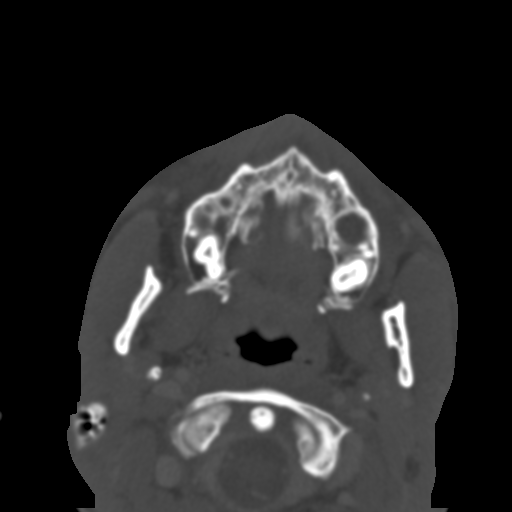
[im 48/87  bone]
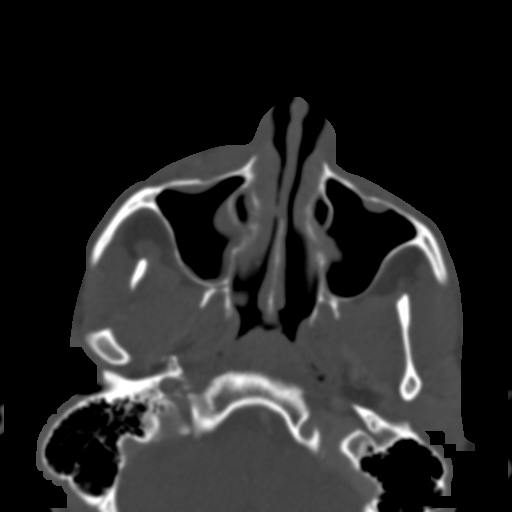
[im 57/87  bone]
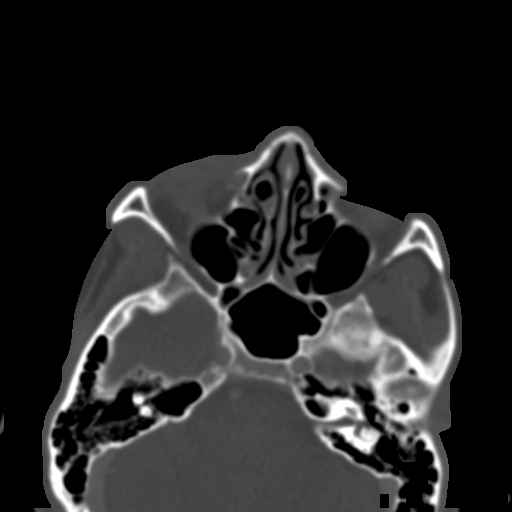
[im 66/87  bone]
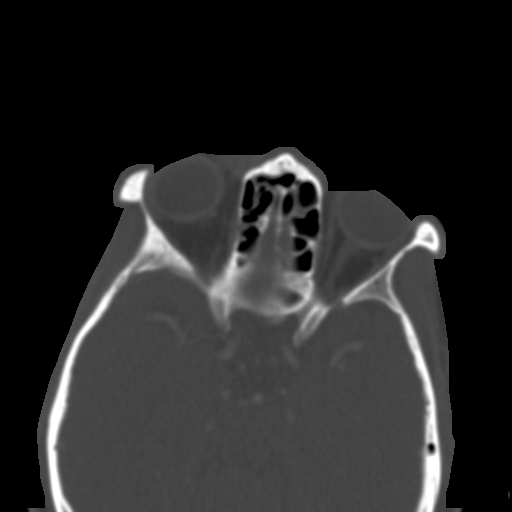
[im 72/87  brain]
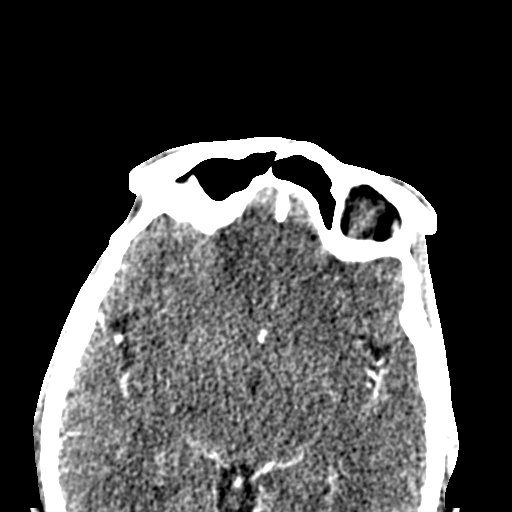
[im 72/87  bone]
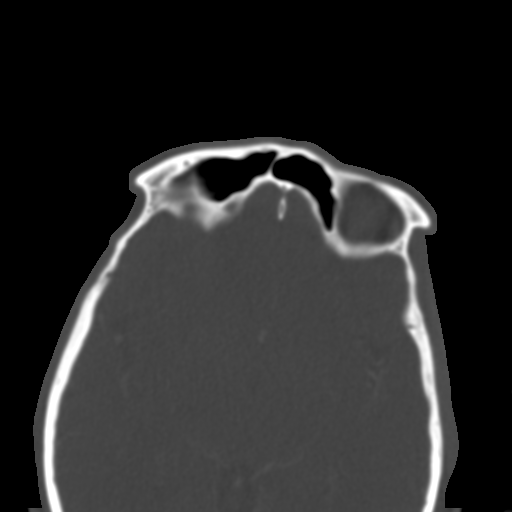
[im 81/87  bone]
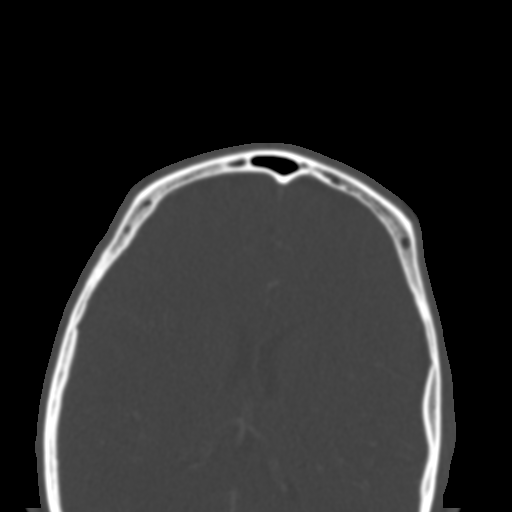

[Series 7: facialbone 2.0 cor st · coronal · 0.35mm/px · 3 of 78 slices shown]
[im 26/78  bone]
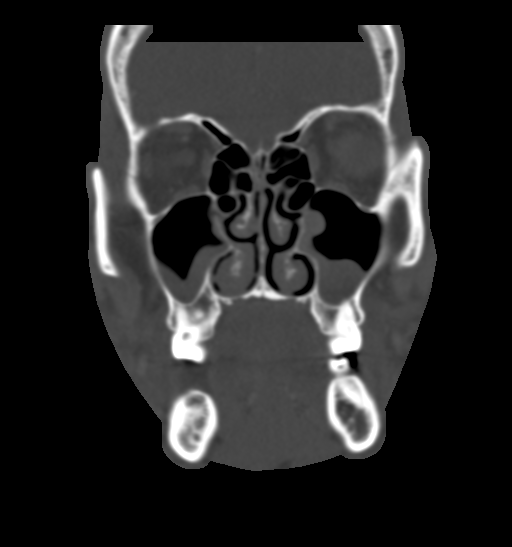
[im 35/78  bone]
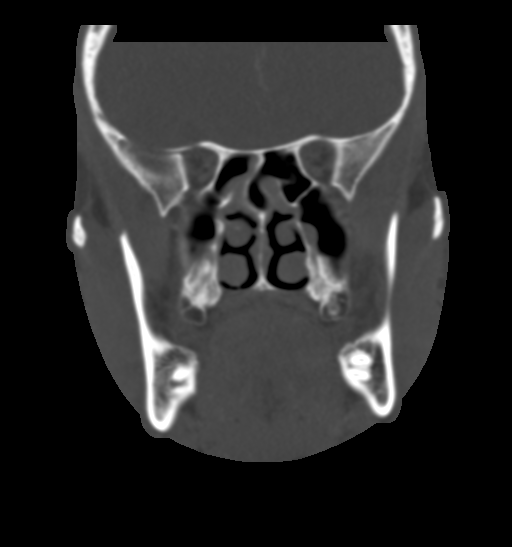
[im 43/78  bone]
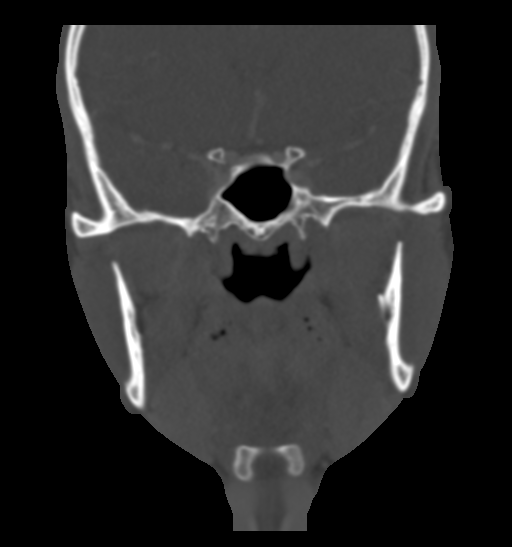

[Series 8: facialbone 2.0 sag st · sagittal · 0.37mm/px · 3 of 76 slices shown]
[im 26/76  bone]
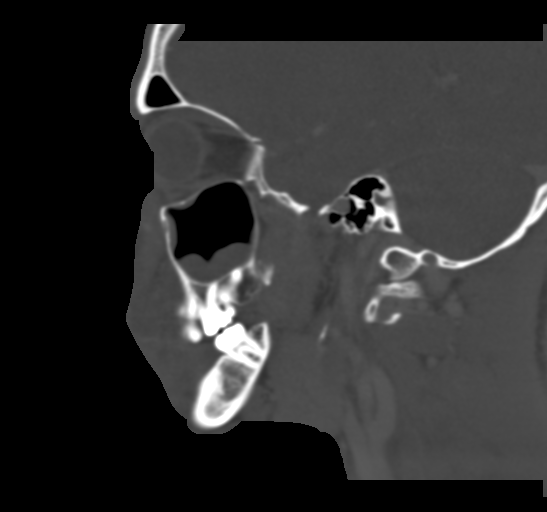
[im 38/76  bone]
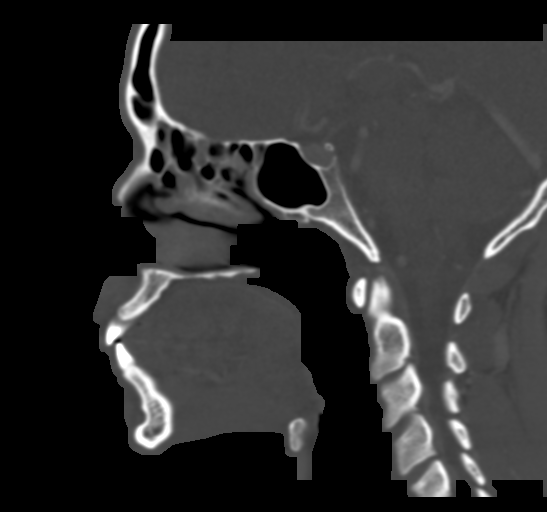
[im 51/76  bone]
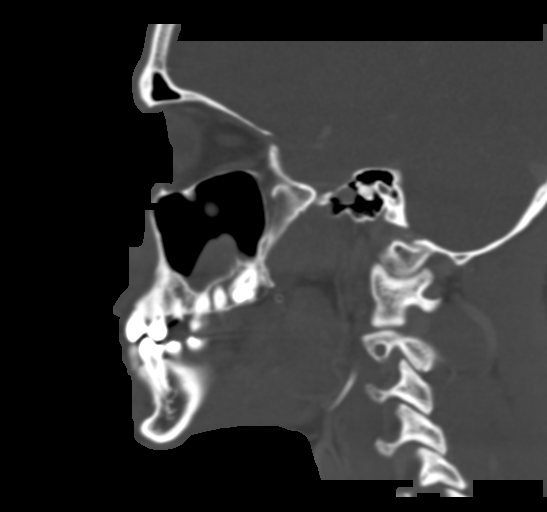

[16 of 47 positions shown; findings below may reference images not displayed]

FINDINGS: Osseous: No fracture or destructive process.

Orbits: Negative.  No inflammation.

Sinuses: Chronic but progressive lobulated mucosal thickening in the
maxillary sinuses, likely retention cysts. Bilateral maxillary tooth
periapical erosions could contribute to the sinus inflammation.
Milder patchy mucosal thickening in the ethmoids.

Soft tissues: Subcutaneous edema and expansion centered around a
ovoid shaped low-density collection just inferior to the right
nostril that measures 10 x 5 mm. No soft tissue gas.

Limited intracranial: Negative
IMPRESSION: 1. 10 x 5 mm abscess inferior to the right nostril with regional
cellulitis.
2. Chronic sinusitis.

## 2020-04-30 ENCOUNTER — Encounter: Payer: Self-pay | Admitting: Physician Assistant

## 2020-04-30 ENCOUNTER — Ambulatory Visit: Payer: Self-pay

## 2020-04-30 ENCOUNTER — Other Ambulatory Visit: Payer: Self-pay

## 2020-04-30 ENCOUNTER — Ambulatory Visit: Payer: Self-pay | Admitting: Physician Assistant

## 2020-04-30 DIAGNOSIS — Z113 Encounter for screening for infections with a predominantly sexual mode of transmission: Secondary | ICD-10-CM

## 2020-04-30 DIAGNOSIS — A5401 Gonococcal cystitis and urethritis, unspecified: Secondary | ICD-10-CM

## 2020-04-30 LAB — GRAM STAIN

## 2020-04-30 MED ORDER — CEFTRIAXONE SODIUM 500 MG IJ SOLR
500.0000 mg | Freq: Once | INTRAMUSCULAR | Status: AC
  Administered 2020-04-30: 500 mg via INTRAMUSCULAR

## 2020-04-30 MED ORDER — DOXYCYCLINE HYCLATE 100 MG PO TABS: 100.0000 mg | ORAL_TABLET | Freq: Two times a day (BID) | ORAL | 0 refills | Status: AC

## 2020-04-30 NOTE — Progress Notes (Signed)
[facility] Department STI clinic/screening visit  Subjective:  Philip Lowery is a 29 y.o. male being seen today for an STI screening visit. The patient reports they do have symptoms.    Patient has the following medical conditions:   Patient Active Problem List   Diagnosis Date Noted  . 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 SEXUALLY TRANSMITTED DISEASE    screening    HPI  Patient reports that he has had yellow discharge and dysuria for that last 2 days.  Denies other symptoms, chronic conditions and regular medicines.  Reports last HIV testing 03/2019.  Last void prior to sample collection for Gram stain was 1 hr ago.   See flowsheet for further details and programmatic requirements.    The following portions of the patient's history were reviewed and updated as appropriate: allergies, current medications, past medical history, past social history, past surgical history and problem list.  Objective:  There were no vitals filed for this visit.  Physical Exam Constitutional:      General: He is not in acute distress.    Appearance: Normal appearance.  HENT:     Head: Normocephalic and atraumatic.     Comments: No nits,lice, or hair loss. No cervical, supraclavicular or axillary adenopathy.    Mouth/Throat:     Mouth: Mucous membranes are moist.     Pharynx: Oropharynx is clear. No oropharyngeal exudate or posterior oropharyngeal erythema.  Eyes:     Conjunctiva/sclera: Conjunctivae normal.  Pulmonary:     Effort: Pulmonary effort is normal.  Abdominal:     Palpations: Abdomen is soft. There is no mass.     Tenderness: There is no abdominal tenderness. There is no guarding or rebound.  Genitourinary:    Penis: Normal.      Testes: Normal.     Comments: Pubic area without nits, lice, hair loss, edema, erythema, lesions and inguinal adenopathy.  Penis circumcised without rash or lesions. Small amount of whitish  discharge at meatus. Musculoskeletal:     Cervical back: Neck supple. No tenderness.  Skin:    General: Skin is warm and dry.     Findings: No bruising, erythema, lesion or rash.  Neurological:     Mental Status: He is alert and oriented to person, place, and time.  Psychiatric:        Mood and Affect: Mood normal.        Behavior: Behavior normal.        Thought Content: Thought content normal.        Judgment: Judgment normal.       Assessment and Plan:  Somalia J Welter is a 29 y.o. male presenting to the [facility] Department for STI screening  1. Screening for STD (sexually transmitted disease) Patient into clinic with symptoms. Rec condoms with all sex. Await test results.  Counseled that RN will call if needs to RTC for treatment once results are back. - Gram stain - Gonococcus culture - HBV Antigen/[facility] - HIV/HCV [place] [facility] - Syphilis Serology, [facility]  2. Gonococcal urethritis in male Will treat for GC and cover for Chlamydia with Ceftriaxone 500 mg IM and Doxycycline 100 mg #14 1 po BID for 7 days. No sex for 14 days and until after partner completes treatment. Patient left room after receiving his injection while I went to get contact cards, though he had been counseled that we recommend waiting  for 15 min after injection for observation. - cefTRIAXone (ROCEPHIN) injection 500 mg - doxycycline (VIBRA-TABS) 100 MG tablet; Take 1 tablet (100 mg total) by mouth 2 (two) times daily for 7 days.  Dispense: 14 tablet; Refill: 0     No follow-ups on file.  No future appointments.  Matt Holmes, PA

## 2020-05-03 NOTE — Progress Notes (Signed)
Chart reviewed by Pharmacist  Suzanne Walker PharmD, Contract Pharmacist at [facility]  

## 2020-07-10 ENCOUNTER — Other Ambulatory Visit: Payer: Self-pay

## 2020-07-10 ENCOUNTER — Encounter: Payer: Self-pay | Admitting: Physician Assistant

## 2020-07-10 ENCOUNTER — Ambulatory Visit: Payer: Self-pay | Admitting: Physician Assistant

## 2020-07-10 DIAGNOSIS — A5401 Gonococcal cystitis and urethritis, unspecified: Secondary | ICD-10-CM

## 2020-07-10 DIAGNOSIS — Z113 Encounter for screening for infections with a predominantly sexual mode of transmission: Secondary | ICD-10-CM

## 2020-07-10 LAB — GRAM STAIN

## 2020-07-10 MED ORDER — CEFTRIAXONE SODIUM 500 MG IJ SOLR
500.0000 mg | Freq: Once | INTRAMUSCULAR | Status: AC
  Administered 2020-07-10: 500 mg via INTRAMUSCULAR

## 2020-07-10 MED ORDER — DOXYCYCLINE HYCLATE 100 MG PO TABS: 100.0000 mg | ORAL_TABLET | Freq: Two times a day (BID) | ORAL | 0 refills | Status: AC

## 2020-07-10 NOTE — Progress Notes (Signed)
[facility] Department STI clinic/screening visit  Subjective:  Philip Lowery is a 30 y.o. male being seen today for an STI screening visit. The patient reports they do have symptoms.    Patient has the following medical conditions:   Patient Active Problem List   Diagnosis Date Noted  . 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 SEXUALLY TRANSMITTED DISEASE    screening    HPI  Patient reports that he has had yellow dishcarge and dysuria for about 1 week.  Denies other symptoms, chronic conditions and regular medicines.  Reports that last HIV test was 2 months ago and last void prior to sample collection for Gram stain was over 2 hr ago.   See flowsheet for further details and programmatic requirements.    The following portions of the patient's history were reviewed and updated as appropriate: allergies, current medications, past medical history, past social history, past surgical history and problem list.  Objective:  There were no vitals filed for this visit.  Physical Exam Constitutional:      General: He is not in acute distress.    Appearance: Normal appearance.  HENT:     Head: Normocephalic and atraumatic.     Comments: No nits,lice, or hair loss. No cervical, supraclavicular or axillary adenopathy.    Mouth/Throat:     Mouth: Mucous membranes are moist.     Pharynx: Oropharynx is clear. No oropharyngeal exudate or posterior oropharyngeal erythema.  Eyes:     Conjunctiva/sclera: Conjunctivae normal.  Pulmonary:     Effort: Pulmonary effort is normal.  Abdominal:     Palpations: Abdomen is soft. There is no mass.     Tenderness: There is no abdominal tenderness. There is no guarding or rebound.  Genitourinary:    Penis: Normal.      Testes: Normal.  Musculoskeletal:     Cervical back: Neck supple. No tenderness.  Skin:    General: Skin is warm and dry.     Findings: No bruising, erythema, lesion or rash.   Neurological:     Mental Status: He is alert and oriented to person, place, and time.  Psychiatric:        Mood and Affect: Mood normal.        Behavior: Behavior normal.        Thought Content: Thought content normal.        Judgment: Judgment normal.       Assessment and Plan:  Somalia J Craun is a 30 y.o. male presenting to the [facility] Department for STI screening  1. Screening for STD (sexually transmitted disease) Patient into clinic with symptoms. Reviewed with patient Gram stain results and need for treatment. Rec condoms with all sex. Await test results.  Counseled that RN will call if needs to RTC for treatment once results are back. - Gram stain - HIV [place] [facility] - Syphilis Serology, [facility]  2. Gonococcal urethritis in male Will treat for GC and cover for Chlamydia with Ceftriaxone 500 mg IM and Doxycycline 100 mg #14 1 po BID for 7 days. No sex for 14 days and until after partner completes treatment. Call with questions or concerns. Patient observed for 15 min after injection and released without SE or incident. - cefTRIAXone (ROCEPHIN) injection 500 mg - doxycycline (VIBRA-TABS) 100 MG tablet; Take 1 tablet (100 mg total) by mouth 2 (two) times daily for 7 days.  Dispense: 14  tablet; Refill: 0     No follow-ups on file.  No future appointments.  Jerene Dilling, PA

## 2020-07-11 NOTE — Progress Notes (Signed)
Chart reviewed by Pharmacist  Suzanne Walker PharmD, Contract Pharmacist at [facility]  

## 2020-07-23 ENCOUNTER — Encounter: Payer: Self-pay | Admitting: Student

## 2020-07-23 LAB — HM HIV SCREENING LAB: HM HIV Screening: NEGATIVE

## 2020-09-11 ENCOUNTER — Encounter (HOSPITAL_COMMUNITY): Payer: Self-pay

## 2020-09-11 ENCOUNTER — Other Ambulatory Visit: Payer: Self-pay

## 2020-09-11 ENCOUNTER — Ambulatory Visit (HOSPITAL_COMMUNITY)
Admission: EM | Admit: 2020-09-11 | Discharge: 2020-09-11 | Disposition: A | Discharge status: Home / Self Care | Payer: Self-pay | Attending: Emergency Medicine | Admitting: Emergency Medicine

## 2020-09-11 DIAGNOSIS — K047 Periapical abscess without sinus: Secondary | ICD-10-CM

## 2020-09-11 MED ORDER — AMOXICILLIN 875 MG PO TABS: 875.0000 mg | ORAL_TABLET | Freq: Two times a day (BID) | ORAL | 0 refills | Status: AC

## 2020-09-11 NOTE — Discharge Instructions (Addendum)
Take Amoxicillin twice daily for ten days.  Please make appointment with local dentist.  Continue using Tylenol and Ibuprofen alternating for pain.
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
## 2020-09-11 NOTE — ED Triage Notes (Signed)
Pt in with c/o abscess on tooth on the right side of his mouth  Pt has been taking advil for sxs

## 2021-10-01 ENCOUNTER — Encounter (HOSPITAL_COMMUNITY): Payer: Self-pay | Admitting: Emergency Medicine

## 2021-10-01 ENCOUNTER — Ambulatory Visit (HOSPITAL_COMMUNITY)
Admission: EM | Admit: 2021-10-01 | Discharge: 2021-10-01 | Disposition: A | Discharge status: Home / Self Care | Payer: Self-pay | Attending: Physician Assistant | Admitting: Physician Assistant

## 2021-10-01 DIAGNOSIS — K0889 Other specified disorders of teeth and supporting structures: Secondary | ICD-10-CM

## 2021-10-01 DIAGNOSIS — K047 Periapical abscess without sinus: Secondary | ICD-10-CM

## 2021-10-01 MED ORDER — AMOXICILLIN-POT CLAVULANATE 875-125 MG PO TABS: 1.0000 | ORAL_TABLET | Freq: Two times a day (BID) | ORAL | 0 refills | Status: AC

## 2021-10-01 NOTE — Discharge Instructions (Signed)
You have a dental abscess.  Start Augmentin twice daily.  This can upset your stomach so take it with food.  Gargle with warm salt water for symptom relief.  Alternate Tylenol ibuprofen for pain.  It is importantly follow-up with a dentist as soon as possible; call to schedule an appointment.  Please call to schedule an appointment.  If you develop any worsening symptoms including difficulty speaking, difficulty swallowing, swelling of your throat you need to be seen in the emergency room immediately. ?
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
## 2021-10-01 NOTE — ED Triage Notes (Signed)
Pt reports upper right dental pain x 1 day. States he has an abscess on the upper gums.  ?

## 2023-05-26 ENCOUNTER — Ambulatory Visit (HOSPITAL_COMMUNITY)
Admission: EM | Admit: 2023-05-26 | Discharge: 2023-05-26 | Disposition: A | Discharge status: Home / Self Care | Payer: Self-pay | Attending: Internal Medicine | Admitting: Internal Medicine

## 2023-05-26 ENCOUNTER — Encounter (HOSPITAL_COMMUNITY): Payer: Self-pay | Admitting: *Deleted

## 2023-05-26 DIAGNOSIS — R369 Urethral discharge, unspecified: Secondary | ICD-10-CM | POA: Insufficient documentation

## 2023-05-26 DIAGNOSIS — Z113 Encounter for screening for infections with a predominantly sexual mode of transmission: Secondary | ICD-10-CM | POA: Insufficient documentation

## 2023-05-26 NOTE — ED Triage Notes (Signed)
C/O penile discharge onset 2 days ago without pain.
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
## 2023-05-26 NOTE — Discharge Instructions (Signed)
Penile swab is pending.  We will call if it is positive and send any appropriate treatment.  Please refrain from sexual activity until test results and treatment are complete.

## 2023-05-29 ENCOUNTER — Ambulatory Visit: Payer: Self-pay | Admitting: Nurse Practitioner

## 2023-05-29 DIAGNOSIS — A549 Gonococcal infection, unspecified: Secondary | ICD-10-CM

## 2023-05-29 DIAGNOSIS — N341 Nonspecific urethritis: Secondary | ICD-10-CM

## 2023-05-29 DIAGNOSIS — Z113 Encounter for screening for infections with a predominantly sexual mode of transmission: Secondary | ICD-10-CM

## 2023-05-29 LAB — GRAM STAIN

## 2023-05-29 LAB — HM HEPATITIS C SCREENING LAB: HM Hepatitis Screen: NEGATIVE

## 2023-05-29 LAB — HM HIV SCREENING LAB: HM HIV Screening: NEGATIVE

## 2023-05-29 LAB — HEPATITIS B SURFACE ANTIGEN: Hepatitis B Surface Ag: NONREACTIVE

## 2023-05-29 MED ORDER — DOXYCYCLINE HYCLATE 100 MG PO TABS: 100.0000 mg | ORAL_TABLET | Freq: Two times a day (BID) | ORAL | Status: AC

## 2023-05-29 MED ORDER — CEFTRIAXONE SODIUM 500 MG IJ SOLR
500.0000 mg | Freq: Once | INTRAMUSCULAR | Status: AC
  Administered 2023-05-29: 500 mg via INTRAMUSCULAR

## 2023-05-29 NOTE — Progress Notes (Signed)
Pt is here for STD visit.  Gram stain results reviewed with pt,  treatment required per standing order.  Condoms declined. Rocephin IM given in R. Deltoid, tolerated well with no reaction. The patient was dispensed doxycyline today. I provided counseling today regarding the medication. We discussed the medication, the side effects and when to call clinic. Patient given the opportunity to ask questions. Questions answered.   2 contact cards given.  Gaspar Garbe, RN

## 2023-05-29 NOTE — Progress Notes (Addendum)
[facility] Department STI clinic/screening visit  Subjective:  Philip Lowery is a 32 y.o. male being seen today for an STI screening visit. The patient reports they do have symptoms.    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 male who presents to the clinic for STI testing. Patient indicates genital itching the the posterior aspect of the shaft of his penis with lower abdominal pain, and yellow/malodorous/thick penile discharge. He states these symptoms have been present for a couple of weeks and the lower abdominal pain has resolved spontaneously, but discharge is still present. The patient also indicates a pruritic rash to the left hip.  He reports 2 male partners within the last 2 months, oral and insertive penile sexual practices. He does not want children in the next year. He does not use condoms to prevent STI or pregnancy and no other forms of contraception. Patient has a history of treated gonorrhea and chlamydia, but cannot remember when he was diagnosed. Chart review is non-revealing.  Patient has a history of illegal drug use, use of prescription drugs not as prescribed, and incarceration.    Last HIV test per patient/review of record was  Lab Results  Component Value Date   HMHIVSCREEN Negative - Validated 05/29/2023   No results found for: "HIV"  Last HEPC test per patient/review of record was  Lab Results  Component Value Date   HMHEPCSCREEN Negative-Validated 05/29/2023   No components found for: "HEPC"   Last HEPB test per patient/review of record was No components found for: "HMHEPBSCREEN" No components found for: "HEPC"   Does the patient or their partner desires a pregnancy in the next year? No  Screening for MPX risk: Does the  patient have an unexplained rash? No Is the patient MSM? No Does the patient endorse multiple sex partners or anonymous sex partners? Yes Did the patient have close or sexual contact with a person diagnosed with MPX? No Has the patient traveled outside the [place] where MPX is endemic? No Is there a high clinical suspicion for MPX-- evidenced by one of the following No  -Unlikely to be chickenpox  -Lymphadenopathy  -Rash that present in same phase of evolution on any given body part   See flowsheet for further details and programmatic requirements.    There is no immunization history on file for this patient.   The following portions of the patient's history were reviewed and updated as appropriate: allergies, current medications, past medical history, past social history, past surgical history and problem list.  Objective:  There were no vitals filed for this visit.  Physical Exam Nursing note reviewed.  Constitutional:      Appearance: Normal appearance.  HENT:     Head: Normocephalic and atraumatic.     Salivary Glands: Right salivary gland is not diffusely enlarged or tender. Left salivary gland is not diffusely enlarged or tender.     Comments: No nits or hair loss    Mouth/Throat:     Lips: Pink. No lesions.     Mouth: Mucous membranes are moist. No oral lesions.     Tongue: No lesions. Tongue does not deviate from midline.     Pharynx: Oropharynx is clear. Uvula midline. No oropharyngeal exudate or posterior  oropharyngeal erythema.     Tonsils: No tonsillar exudate.  Eyes:     General:        Right eye: No discharge.        Left eye: No discharge.     Conjunctiva/sclera:     Right eye: Right conjunctiva is not injected. No exudate.    Left eye: Left conjunctiva is not injected. No exudate. Pulmonary:     Effort: Pulmonary effort is normal.  Genitourinary:    Pubic Area: No rash or pubic lice (no nits).      Penis: Normal. No tenderness, discharge, swelling or lesions.       Testes: Normal.     Epididymis:     Right: Normal. No mass or tenderness.     Left: Normal. No mass or tenderness.     Tanner stage (genital): 5.     Rectum: No tenderness (no lesions or discharge).     Comments: Penile Discharge Amount: minimal Color:  white/yellow Lymphadenopathy:     Head:     Right side of head: No submental, submandibular, tonsillar, preauricular or posterior auricular adenopathy.     Left side of head: No submental, submandibular, tonsillar, preauricular or posterior auricular adenopathy.     Cervical: No cervical adenopathy.     Right cervical: No superficial or posterior cervical adenopathy.    Left cervical: No superficial or posterior cervical adenopathy.     Upper Body:     Right upper body: No supraclavicular or axillary adenopathy.     Left upper body: No supraclavicular or axillary adenopathy.     Lower Body: No right inguinal adenopathy. No left inguinal adenopathy.  Skin:    General: Skin is warm and dry.     Findings: No lesion or rash.  Neurological:     Mental Status: He is alert and oriented to person, place, and time.  Psychiatric:        Attention and Perception: Attention normal.        Mood and Affect: Mood and affect normal.        Speech: Speech normal.        Behavior: Behavior normal. Behavior is cooperative.        Thought Content: Thought content normal.     Assessment and Plan:  Philip Lowery is a 32 y.o. male presenting to the [facility] Department for STI screening  1. Screening for venereal disease  - HBV Antigen/Antibody [facility] - HIV/HCV [place] [place] [facility] - Syphilis Serology, [facility] - Gram stain  2. NGU (nongonococcal urethritis) Penile gram stain positive for WBC>2/HPF. Treating as indicated below for NGU per protocol.  - doxycycline (VIBRA-TABS) 100 MG tablet; Take 1 tablet (100 mg total) by mouth 2 (two) times daily for 7 days.  3. Gonorrhea Penile gram stain positive for intracellular GND.  Treated per protocol as indicated below.  - cefTRIAXone (ROCEPHIN) injection 500 mg   Patient does have STI symptoms Patient accepted all screenings including  urine GC/Chlamydia, and blood work for HIV/Syphilis. Patient meets criteria for HepB screening? Yes. Ordered? yes Patient meets criteria for HepC screening? Yes. Ordered? yes Recommended condom use with all sex Discussed importance of condom use for STI prevention  Treat positive test results per standing order. Discussed time line for State [facility] results and that patient will be called with positive results and encouraged patient to call if he had not heard in 2 weeks Recommended repeat testing in 3 months with positive  results. Recommended returning for continued or worsening symptoms.   Return if symptoms worsen or fail to improve.  No future appointments.  Total time with patient 30 minutes.  Edmonia James, NP

## 2023-05-30 ENCOUNTER — Telehealth: Payer: Self-pay

## 2023-05-30 MED ORDER — METRONIDAZOLE 500 MG PO TABS: 2000.0000 mg | ORAL_TABLET | Freq: Once | ORAL | 0 refills | Status: AC

## 2023-05-30 NOTE — Telephone Encounter (Signed)
Per protocol, pt will need to return to UC for Nurse Visit for Rocephin 500mg  IM.  Per protocol, pt requires tx with metronidazole. Attempted to reach patient x1. VM full.  Rx sent to pharmacy on file.

## 2023-05-31 LAB — CYTOLOGY, (ORAL, ANAL, URETHRAL) ANCILLARY ONLY
Chlamydia: NEGATIVE
Comment: NEGATIVE
Comment: NEGATIVE
Comment: NORMAL
Neisseria Gonorrhea: POSITIVE — AB
Trichomonas: POSITIVE — AB

## 2023-07-27 ENCOUNTER — Ambulatory Visit (HOSPITAL_COMMUNITY)
Admission: EM | Admit: 2023-07-27 | Discharge: 2023-07-27 | Disposition: A | Discharge status: Home / Self Care | Payer: Self-pay | Attending: Family Medicine | Admitting: Family Medicine

## 2023-07-27 ENCOUNTER — Encounter (HOSPITAL_COMMUNITY): Payer: Self-pay

## 2023-07-27 DIAGNOSIS — B0089 Other herpesviral infection: Secondary | ICD-10-CM

## 2023-07-27 HISTORY — DX: Other herpesviral infection: B00.89

## 2023-07-27 MED ORDER — VALACYCLOVIR HCL 1 G PO TABS: 1000.0000 mg | ORAL_TABLET | Freq: Three times a day (TID) | ORAL | 0 refills | Status: AC
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
## 2023-07-27 NOTE — ED Triage Notes (Signed)
Right hand swelling x 3 days. Patient states he has herpetic whitlow in the hand.   Patient has not taken any medication for his symptoms.
